# Patient Record
Sex: Male | Born: 1960
Health system: Southern US, Community
[De-identification: ages and names within clinical notes are randomized; demographics above are authoritative.]

## PROBLEM LIST (undated history)

## (undated) DIAGNOSIS — T7840XA Allergy, unspecified, initial encounter: Secondary | ICD-10-CM

## (undated) HISTORY — PX: DENTAL SURGERY: SHX609

## (undated) HISTORY — PX: NASAL FRACTURE SURGERY: SHX718

## (undated) HISTORY — DX: Allergy, unspecified, initial encounter: T78.40XA

---

## 2004-07-10 HISTORY — PX: ORIF ORBITAL FRACTURE: SHX5312

## 2015-02-08 ENCOUNTER — Emergency Department (HOSPITAL_BASED_OUTPATIENT_CLINIC_OR_DEPARTMENT_OTHER): Payer: Worker's Compensation

## 2015-02-08 ENCOUNTER — Encounter (HOSPITAL_BASED_OUTPATIENT_CLINIC_OR_DEPARTMENT_OTHER): Payer: Self-pay | Admitting: *Deleted

## 2015-02-08 ENCOUNTER — Emergency Department (HOSPITAL_BASED_OUTPATIENT_CLINIC_OR_DEPARTMENT_OTHER)
Admission: EM | Admit: 2015-02-08 | Discharge: 2015-02-08 | Disposition: A | Discharge status: Home / Self Care | Payer: Worker's Compensation | Attending: Emergency Medicine | Admitting: Emergency Medicine

## 2015-02-08 DIAGNOSIS — S62522B Displaced fracture of distal phalanx of left thumb, initial encounter for open fracture: Secondary | ICD-10-CM | POA: Diagnosis not present

## 2015-02-08 DIAGNOSIS — Y998 Other external cause status: Secondary | ICD-10-CM | POA: Insufficient documentation

## 2015-02-08 DIAGNOSIS — Y9289 Other specified places as the place of occurrence of the external cause: Secondary | ICD-10-CM | POA: Insufficient documentation

## 2015-02-08 DIAGNOSIS — W270XXA Contact with workbench tool, initial encounter: Secondary | ICD-10-CM | POA: Insufficient documentation

## 2015-02-08 DIAGNOSIS — Y9389 Activity, other specified: Secondary | ICD-10-CM | POA: Diagnosis not present

## 2015-02-08 DIAGNOSIS — S62502B Fracture of unspecified phalanx of left thumb, initial encounter for open fracture: Secondary | ICD-10-CM

## 2015-02-08 DIAGNOSIS — S61012A Laceration without foreign body of left thumb without damage to nail, initial encounter: Secondary | ICD-10-CM | POA: Diagnosis present

## 2015-02-08 MED ORDER — LIDOCAINE HCL (PF) 1 % IJ SOLN
5.0000 mL | Freq: Once | INTRAMUSCULAR | Status: AC
  Administered 2015-02-08: 5 mL via INTRADERMAL
  Filled 2015-02-08: qty 5

## 2015-02-08 MED ORDER — HYDROCODONE-ACETAMINOPHEN 5-325 MG PO TABS
1.0000 | ORAL_TABLET | Freq: Once | ORAL | Status: AC
  Administered 2015-02-08: 1 via ORAL
  Filled 2015-02-08: qty 1

## 2015-02-08 MED ORDER — HYDROCODONE-ACETAMINOPHEN 5-325 MG PO TABS: 1.0000 | ORAL_TABLET | Freq: Four times a day (QID) | ORAL | Status: DC | PRN

## 2015-02-08 MED ORDER — CEPHALEXIN 250 MG PO CAPS
500.0000 mg | ORAL_CAPSULE | Freq: Once | ORAL | Status: AC
  Administered 2015-02-08: 500 mg via ORAL
  Filled 2015-02-08: qty 2

## 2015-02-08 MED ORDER — CEPHALEXIN 500 MG PO CAPS: 500.0000 mg | ORAL_CAPSULE | Freq: Two times a day (BID) | ORAL | Status: DC

## 2015-02-08 NOTE — ED Notes (Signed)
Suture cart placed at bedside. 

## 2015-02-08 NOTE — ED Notes (Signed)
Pt amb to room 9 with quick steady gait in nad. Pt reports laceration to left thumb with table saw just pta. Pt supervisor is at bedside, dsd removed, supervisor states he put "stop bleed powder" onto wound, no active bleeding noted, but unable to assess wound due to thick powder all over thumb. Thumb soaked in ns/betadine solution, md at bedside for eval.

## 2015-02-08 NOTE — ED Provider Notes (Signed)
CSN: [id number]     Arrival date & time 02/08/15  0803 History   First MD Initiated Contact with Patient 02/08/15 [phone]     Chief Complaint  Patient presents with  . Laceration     (Consider location/radiation/quality/duration/timing/severity/associated sxs/prior Treatment) HPI Patient presents immediately after sustaining injury to his left thumb while using a table saw. Patient was in his usual state of health prior to the event. Immediately after suffering laceration, patient had topical hemostatic agent applied. No other injuries, no other trauma. Since the event there has been minimal active bleeding, but ongoing moderate pain in the left distal thumb. Patient moves the thumb appropriately.  History reviewed. No pertinent past medical history. History reviewed. No pertinent past surgical history. History reviewed. No pertinent family history. History  Substance Use Topics  . Smoking status: Never Smoker   . Smokeless tobacco: Not on file  . Alcohol Use: Not on file    Review of Systems  Constitutional: Negative for fever.  Respiratory: Negative for shortness of breath.   Cardiovascular: Negative for chest pain.  Musculoskeletal:       Negative aside from HPI  Skin: Positive for wound.       Negative aside from HPI  Allergic/Immunologic: Negative for immunocompromised state.  Neurological: Negative for weakness.      Allergies  Review of patient's allergies indicates no known allergies.  Home Medications   Prior to Admission medications   Not on File   BP 128/88 mmHg  Pulse 78  Temp(Src) 98.2 F (36.8 C) (Oral)  Resp 18  Ht 5\' 11"  (1.803 m)  Wt 170 lb (77.111 kg)  BMI 23.72 kg/m2  SpO2 100% Physical Exam  Constitutional: He is oriented to person, place, and time. He appears well-developed. No distress.  HENT:  Head: Normocephalic and atraumatic.  Eyes: Conjunctivae and EOM are normal.  Cardiovascular: Normal rate and regular rhythm.     Pulmonary/Chest: Effort normal. No stridor. No respiratory distress.  Abdominal: He exhibits no distension.  Musculoskeletal: He exhibits no edema.       Arms: Neurological: He is alert and oriented to person, place, and time.  Skin: Skin is warm and dry.  Psychiatric: He has a normal mood and affect.  Nursing note and vitals reviewed.   ED Course  Procedures (including critical care time) Labs Review Labs Reviewed - No data to display  Imaging Review Dg Finger Thumb Left  02/08/2015   CLINICAL DATA:  Table saw injury today  EXAM: LEFT THUMB 2+V  COMPARISON:  None.  FINDINGS: Three views of the left thumb submitted. There is small avulsion fracture of distal phalanx with associated soft tissue injury. No subluxation.  IMPRESSION: Small avulsion fraction of distal phalanx is associated soft tissue injury/irregularity.   Electronically Signed   By: Lahoma Crocker M.D.   On: 02/08/2015 08:39    I reviewed the x-ray findings the patient, agree with the interpretation, reviewed the images myself. We discussed the depth of the wound, the need for close outpatient follow-up with our hand surgeon, work precautions, return instructions.  LACERATION REPAIR Performed by: Carmin Muskrat Authorized by: Carmin Muskrat Consent: Verbal consent obtained. Risks and benefits: risks, benefits and alternatives were discussed Consent given by: patient Patient identity confirmed: provided demographic data Prepped and Draped in normal sterile fashion Wound explored  Laceration Location: L thumb  Laceration Length: 6cm  No Foreign Bodies seen or palpated  Anesthesia: local infiltration  Local anesthetic: lidocaine 1% no epinephrine  Anesthetic  total: 3 ml  Irrigation method: syringe Amount of cleaning: standard  Skin closure: 4-0  Number of sutures: 3  Technique: rough / loose approximation  Patient tolerance: Patient tolerated the procedure well with no immediate complications.  With  complete avulsion of part of the wound, and with very rough edges on the remainder, loose approximation was performed, with good hemostasis, the procedure was well tolerated.  Patient received a splint well, no consultations.   MDM  Patient presents after sustaining injury while using a table saw. Patient has both avulsion and laceration of the distal thumb, but is neurologically intact. Hemostasis was controlled on site, with minimal active bleeding after arrival here. Patient had substantial irrigation, cleaning, tolerated rough approximation of the edges well, was splinted, discharged in stable condition to follow-up with hand surgery.   Carmin Muskrat, MD 02/08/15 [phone]

## 2015-02-08 NOTE — Discharge Instructions (Signed)
As discussed, with your laceration, and avulsion of your thumb is very important that you follow-up with our hand surgeon specialists. Your sutures should be removed by the hand surgeon.  Please call today for appointment this week.  Return here for concerning changes in your condition.

## 2015-02-08 NOTE — ED Notes (Signed)
Supplies gathered and placed at bedside for md. 

## 2015-02-08 NOTE — ED Notes (Signed)
Patient transported from X-ray

## 2015-02-08 NOTE — ED Notes (Signed)
Patient transported to X-ray 

## 2015-02-08 NOTE — ED Notes (Signed)
MD at bedside suturing. Pt tolerating well.

## 2016-08-06 IMAGING — DX DG FINGER THUMB 2+V*L*
3 series · 3 of 3 positions shown · non-contrast
Comparison: None.

CLINICAL DATA: Table saw injury today

EXAM:
LEFT THUMB 2+V

[finger ap]
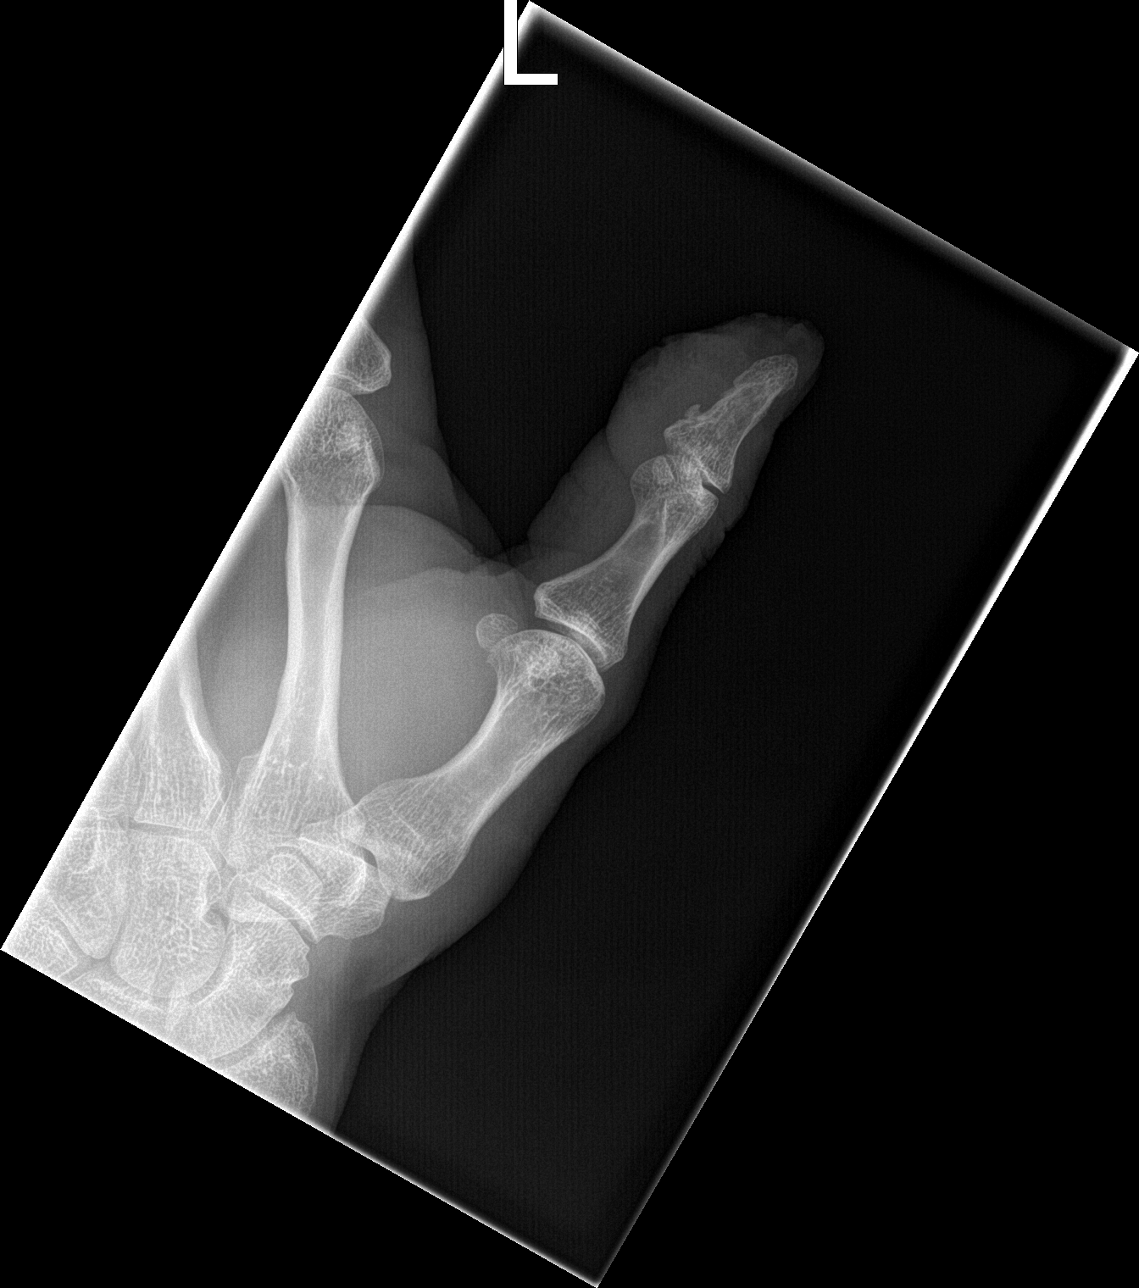

[finger obl]
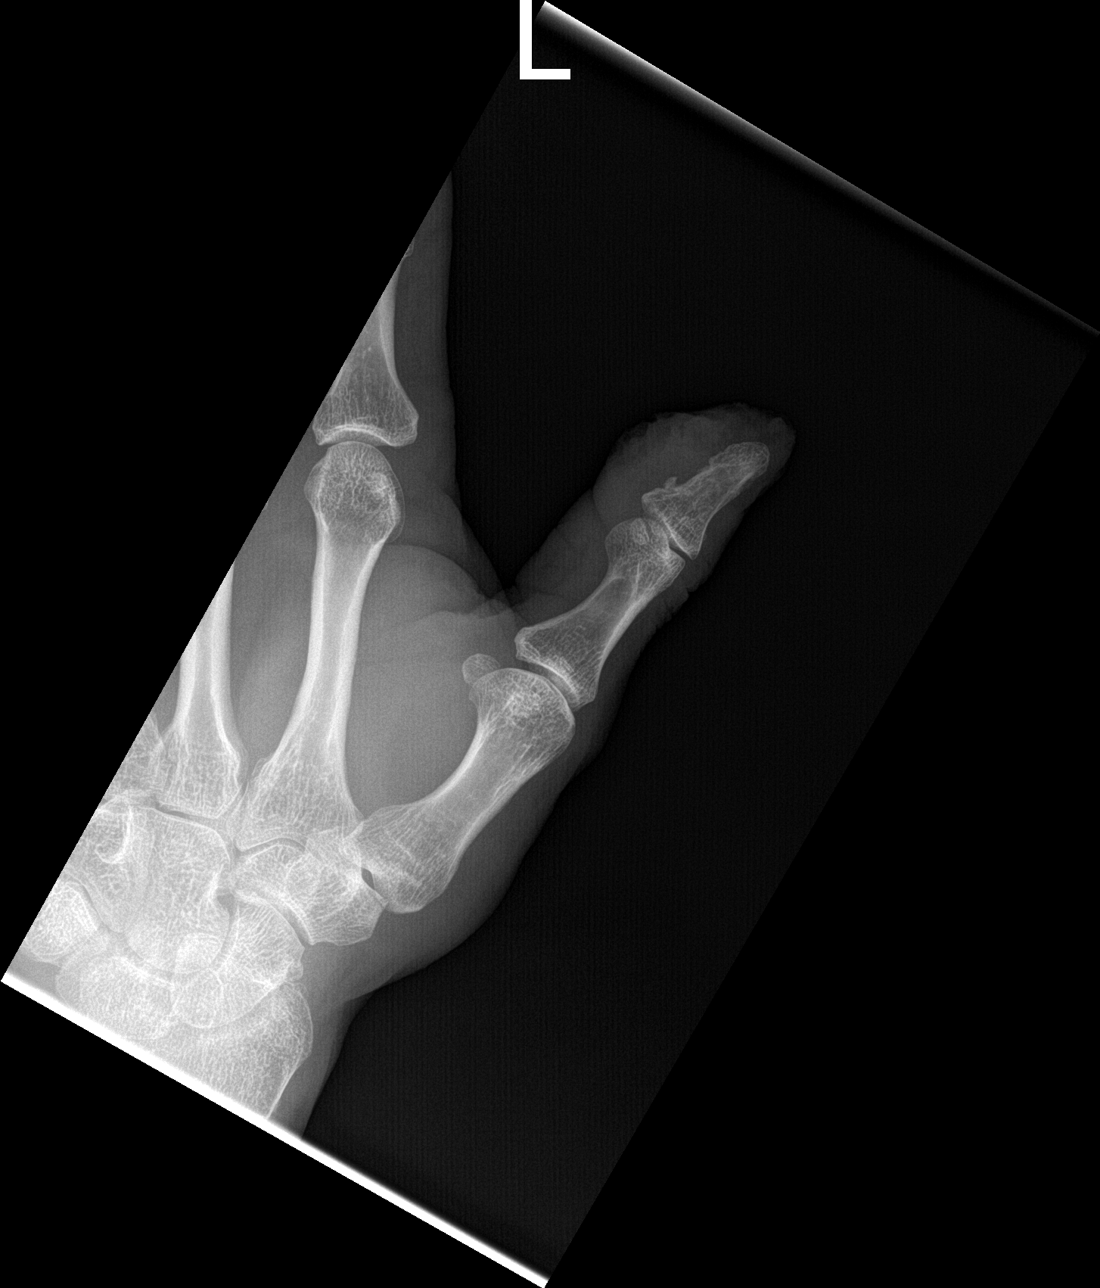

[finger lat]
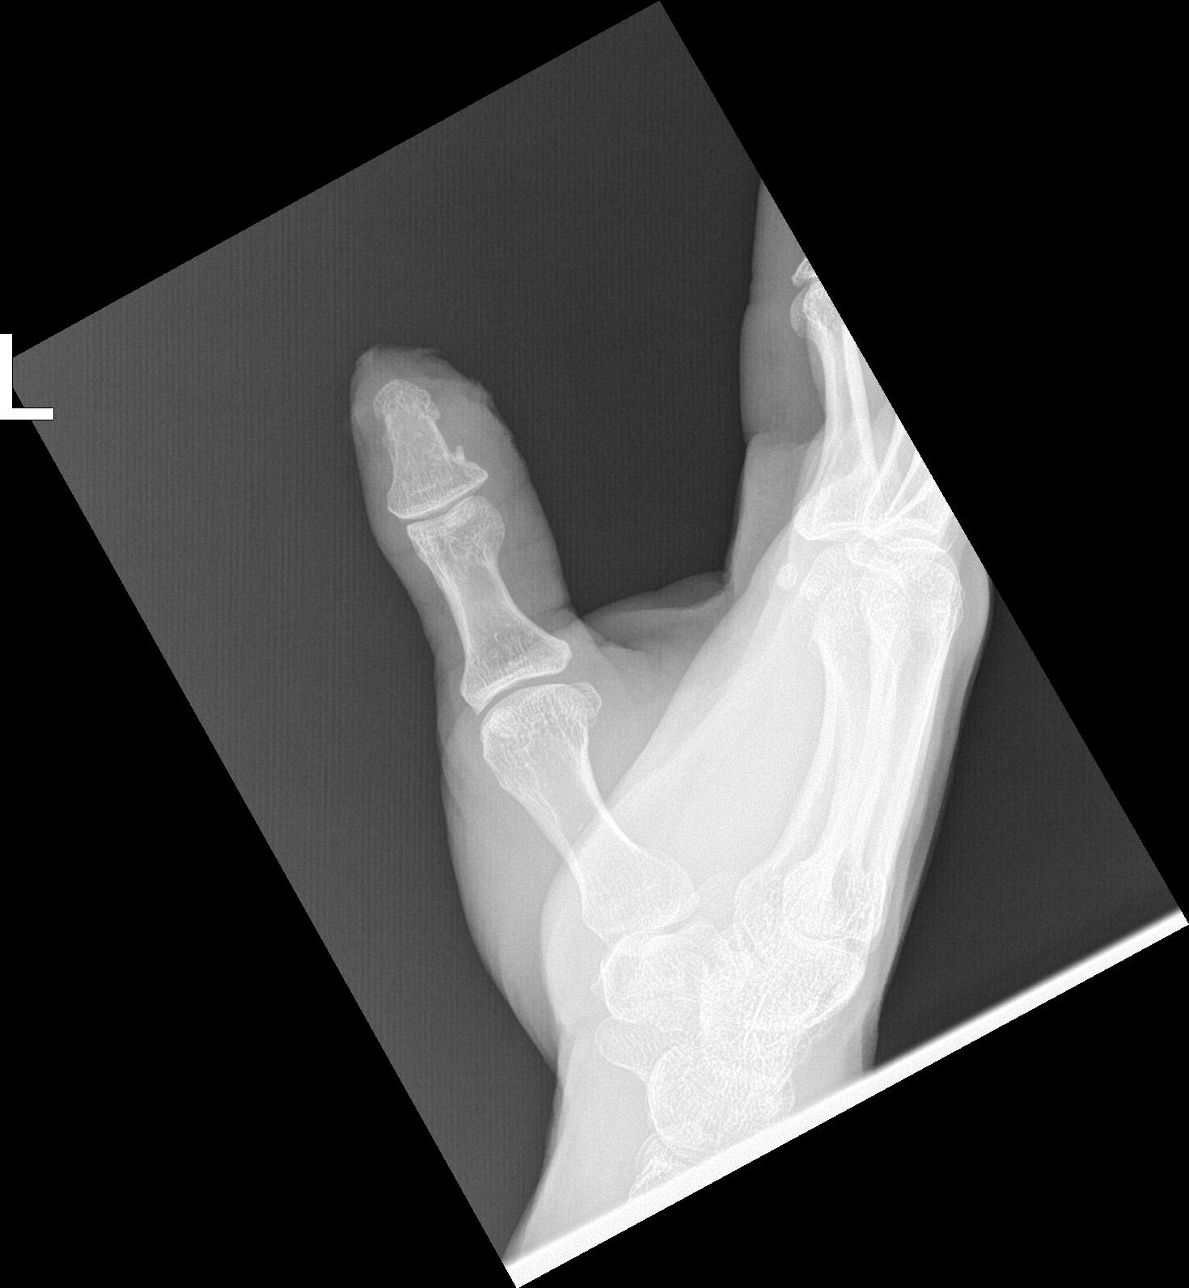

[3 of 3 positions shown; findings below may reference images not displayed]

FINDINGS: Three views of the left thumb submitted. There is small avulsion
fracture of distal phalanx with associated soft tissue injury. No
subluxation.
IMPRESSION: Small avulsion fraction of distal phalanx is associated soft tissue
injury/irregularity.

## 2019-12-16 ENCOUNTER — Telehealth: Payer: Self-pay | Admitting: General Practice

## 2019-12-16 NOTE — Telephone Encounter (Signed)
Paz ok'd new patient appointment

## 2019-12-18 ENCOUNTER — Other Ambulatory Visit: Payer: Self-pay

## 2020-01-09 ENCOUNTER — Encounter: Payer: Self-pay | Admitting: Internal Medicine

## 2020-01-09 ENCOUNTER — Other Ambulatory Visit: Payer: Self-pay

## 2020-01-09 ENCOUNTER — Ambulatory Visit: Payer: 59 | Admitting: Internal Medicine

## 2020-01-09 VITALS — BP 126/86 | HR 76 | Temp 98.3°F | Resp 18 | Ht 71.0 in | Wt 175.0 lb

## 2020-01-09 DIAGNOSIS — Z125 Encounter for screening for malignant neoplasm of prostate: Secondary | ICD-10-CM

## 2020-01-09 DIAGNOSIS — Z0000 Encounter for general adult medical examination without abnormal findings: Secondary | ICD-10-CM | POA: Diagnosis not present

## 2020-01-09 DIAGNOSIS — Z1211 Encounter for screening for malignant neoplasm of colon: Secondary | ICD-10-CM

## 2020-01-09 LAB — COMPREHENSIVE METABOLIC PANEL
ALT: 16 U/L (ref 0–53)
AST: 19 U/L (ref 0–37)
Albumin: 4.6 g/dL (ref 3.5–5.2)
Alkaline Phosphatase: 53 U/L (ref 39–117)
BUN: 13 mg/dL (ref 6–23)
CO2: 31 mEq/L (ref 19–32)
Calcium: 9.7 mg/dL (ref 8.4–10.5)
Chloride: 101 mEq/L (ref 96–112)
Creatinine, Ser: 0.87 mg/dL (ref 0.40–1.50)
GFR: 90.24 mL/min (ref >60.00)
Glucose, Bld: 96 mg/dL (ref 70–99)
Potassium: 4.7 mEq/L (ref 3.5–5.1)
Sodium: 137 mEq/L (ref 135–145)
Total Bilirubin: 0.6 mg/dL (ref 0.2–1.2)
Total Protein: 6.8 g/dL (ref 6.0–8.3)

## 2020-01-09 LAB — CBC WITH DIFFERENTIAL/PLATELET
Basophils Absolute: 0 10*3/uL (ref 0.0–0.1)
Basophils Relative: 0.4 % (ref 0.0–3.0)
Eosinophils Absolute: 0.1 10*3/uL (ref 0.0–0.7)
Eosinophils Relative: 2.2 % (ref 0.0–5.0)
HCT: 45.3 % (ref 39.0–52.0)
Hemoglobin: 15.1 g/dL (ref 13.0–17.0)
Lymphocytes Relative: 22.6 % (ref 12.0–46.0)
Lymphs Abs: 1.2 10*3/uL (ref 0.7–4.0)
MCHC: 33.4 g/dL (ref 30.0–36.0)
MCV: 90.3 fl (ref 78.0–100.0)
Monocytes Absolute: 0.4 10*3/uL (ref 0.1–1.0)
Monocytes Relative: 6.8 % (ref 3.0–12.0)
Neutro Abs: 3.7 10*3/uL (ref 1.4–7.7)
Neutrophils Relative %: 68 % (ref 43.0–77.0)
Platelets: 172 10*3/uL (ref 150.0–400.0)
RBC: 5.02 Mil/uL (ref 4.22–5.81)
RDW: 13.4 % (ref 11.5–15.5)
WBC: 5.5 10*3/uL (ref 4.0–10.5)

## 2020-01-09 LAB — LIPID PANEL
Cholesterol: 182 mg/dL (ref 0–200)
HDL: 58.5 mg/dL (ref >39.00)
LDL Cholesterol: 101 mg/dL — ABNORMAL HIGH (ref 0–99)
NonHDL: 123.84
Total CHOL/HDL Ratio: 3
Triglycerides: 112 mg/dL (ref 0.0–149.0)
VLDL: 22.4 mg/dL (ref 0.0–40.0)

## 2020-01-09 LAB — PSA: PSA: 0.53 ng/mL (ref 0.10–4.00)

## 2020-01-09 LAB — TSH: TSH: 1.11 u[IU]/mL (ref 0.35–4.50)

## 2020-01-09 NOTE — Patient Instructions (Addendum)
Recommend to get a flu shot yearly  GO TO THE LAB : Get the blood work     [place], [place] back for a physical exam in 1 year  We are referring you to the gastroenterologist
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
## 2020-01-09 NOTE — Progress Notes (Signed)
Pre visit review using our clinic review tool, if applicable. No additional management support is needed unless otherwise documented below in the visit note. 

## 2020-01-11 ENCOUNTER — Encounter: Payer: Self-pay | Admitting: Internal Medicine

## 2020-01-11 DIAGNOSIS — Z0000 Encounter for general adult medical examination without abnormal findings: Secondary | ICD-10-CM | POA: Insufficient documentation

## 2020-01-11 NOTE — Assessment & Plan Note (Signed)
Td : ~ 2016 per pt S/p pfizer covid shot rec flu shot q year CCS: + FH sister age ~ 2.  Recommend GI referral Prostate cancer screening: No previous PSAs available, DRE with question of mild induration, see physical exam.  Check a PSA, if normal reassess next year. Labs: CMP, FLP, CBC, TSH, PSA. Lifestyle: Healthy, takes walks 3 times a week, diet is low-salt low-fat.

## 2020-01-14 ENCOUNTER — Encounter: Payer: Self-pay | Admitting: Gastroenterology

## 2020-02-24 ENCOUNTER — Ambulatory Visit (AMBULATORY_SURGERY_CENTER): Payer: Self-pay | Admitting: *Deleted

## 2020-02-24 ENCOUNTER — Other Ambulatory Visit: Payer: Self-pay

## 2020-02-24 VITALS — Ht 71.0 in | Wt 175.0 lb

## 2020-02-24 DIAGNOSIS — Z800 Family history of malignant neoplasm of digestive organs: Secondary | ICD-10-CM

## 2020-02-24 MED ORDER — SUTAB 1479-225-188 MG PO TABS: 24.0000 | ORAL_TABLET | ORAL | 0 refills | Status: DC

## 2020-02-24 MED FILL — SUTAB 1479-225-188 MG TABS: 1479-225-18 | 1 days supply | Qty: 24 | Fill #0

## 2020-02-24 NOTE — Progress Notes (Signed)

## 2020-02-25 ENCOUNTER — Encounter: Payer: Self-pay | Admitting: Gastroenterology

## 2020-03-02 ENCOUNTER — Encounter: Payer: Self-pay | Admitting: Certified Registered Nurse Anesthetist

## 2020-03-03 ENCOUNTER — Encounter: Payer: Self-pay | Admitting: Gastroenterology

## 2020-03-03 ENCOUNTER — Ambulatory Visit (AMBULATORY_SURGERY_CENTER): Payer: 59 | Admitting: Gastroenterology

## 2020-03-03 ENCOUNTER — Other Ambulatory Visit: Payer: Self-pay

## 2020-03-03 VITALS — BP 120/80 | HR 61 | Temp 97.7°F | Resp 20 | Ht 71.0 in | Wt 175.0 lb

## 2020-03-03 DIAGNOSIS — K640 First degree hemorrhoids: Secondary | ICD-10-CM

## 2020-03-03 DIAGNOSIS — D125 Benign neoplasm of sigmoid colon: Secondary | ICD-10-CM | POA: Diagnosis not present

## 2020-03-03 DIAGNOSIS — Z1211 Encounter for screening for malignant neoplasm of colon: Secondary | ICD-10-CM

## 2020-03-03 DIAGNOSIS — Z800 Family history of malignant neoplasm of digestive organs: Secondary | ICD-10-CM | POA: Diagnosis present

## 2020-03-03 MED ORDER — SODIUM CHLORIDE 0.9 % IV SOLN: 500.0000 mL | Freq: Once | INTRAVENOUS | Status: DC

## 2020-03-03 NOTE — Op Note (Signed)
Garrett Oconnor Patient Name: Garrett Oconnor Procedure Date: 03/03/2020 6:58 AM MRN: [id number] Endoscopist: Gerrit Heck , MD Age: 59 Referring MD:  Date of Birth: Aug 20, 1960 Gender: Male Account #: [id number] Procedure:                Colonoscopy Indications:              Screening in patient at increased risk: Colorectal                            cancer in sister before age 75, This is the                            patient's first colonoscopy. He is otherwise                            without active GI symptoms. Medicines:                Monitored Anesthesia Care Procedure:                Pre-Anesthesia Assessment:                           - Prior to the procedure, a History and Physical                            was performed, and patient medications and                            allergies were reviewed. The patient's tolerance of                            previous anesthesia was also reviewed. The risks                            and benefits of the procedure and the sedation                            options and risks were discussed with the patient.                            All questions were answered, and informed consent                            was obtained. Prior Anticoagulants: The patient has                            taken no previous anticoagulant or antiplatelet                            agents. ASA Grade Assessment: II - A patient with                            mild systemic disease. After reviewing the risks  and benefits, the patient was deemed in                            satisfactory condition to undergo the procedure.                           After obtaining informed consent, the colonoscope                            was passed under direct vision. Throughout the                            procedure, the patient's blood pressure, pulse, and                            oxygen saturations were monitored  continuously. The                            Colonoscope was introduced through the anus and                            advanced to the the cecum, identified by                            appendiceal orifice and ileocecal valve. The                            colonoscopy was performed without difficulty. The                            patient tolerated the procedure well. The quality                            of the bowel preparation was good. The ileocecal                            valve, appendiceal orifice, and rectum were                            photographed. Scope In: 8:05:24 AM Scope Out: 8:23:17 AM Scope Withdrawal Time: 0 hours 15 minutes 35 seconds  Total Procedure Duration: 0 hours 17 minutes 53 seconds  Findings:                 The perianal and digital rectal examinations were                            normal.                           Two sessile polyps were found in the sigmoid colon.                            The polyps were 6 to 8 mm in size. These polyps  were removed with a cold snare. Resection and                            retrieval were complete. Estimated blood loss was                            minimal.                           Non-bleeding internal hemorrhoids were found during                            retroflexion. The hemorrhoids were medium-sized.                           The exam was otherwise normal throughout the                            remainder of the colon. Complications:            No immediate complications. Estimated Blood Loss:     Estimated blood loss was minimal. Impression:               - Two 6 to 8 mm polyps in the sigmoid colon,                            removed with a cold snare. Resected and retrieved.                           - Non-bleeding internal hemorrhoids. Recommendation:           - Patient has a contact number available for                            emergencies. The signs and symptoms of  potential                            delayed complications were discussed with the                            patient. Return to normal activities tomorrow.                            Written discharge instructions were provided to the                            patient.                           - Resume previous diet.                           - Continue present medications.                           - Await pathology results.                           -  Repeat colonoscopy in 3 - 5 years for                            surveillance based on pathology results.                           - Return to GI clinic PRN.                           - Use fiber, for example Citrucel, Fibercon, Konsyl                            or Metamucil.                           - Internal hemorrhoids were noted on this study and                            may be amenable to hemorrhoid band ligation. If you                            are interested in further treatment of these                            hemorrhoids with band ligation, please contact my                            clinic to set up an appointment for evaluation and                            treatment. Gerrit Heck, MD 03/03/2020 8:27:59 AM

## 2020-03-03 NOTE — Progress Notes (Signed)
Called to room to assist during endoscopic procedure.  Patient ID and intended procedure confirmed with present staff. Received instructions for my participation in the procedure from the performing physician.  

## 2020-03-03 NOTE — Progress Notes (Signed)
VS-CW  Pt's states no medical or surgical changes since previsit or office visit.  

## 2020-03-03 NOTE — Progress Notes (Signed)
Report given to PACU, vss 

## 2020-03-03 NOTE — Patient Instructions (Signed)
YOU HAD AN ENDOSCOPIC PROCEDURE TODAY AT [facility]:   Refer to the procedure report that was given to you for any specific questions about what was found during the examination.  If the procedure report does not answer your questions, please call your gastroenterologist to clarify.  If you requested that your care partner not be given the details of your procedure findings, then the procedure report has been included in a sealed envelope for you to review at your convenience later.  YOU SHOULD EXPECT: Some feelings of bloating in the abdomen. Passage of more gas than usual.  Walking can help get rid of the air that was put into your GI tract during the procedure and reduce the bloating. If you had a lower endoscopy (such as a colonoscopy or flexible sigmoidoscopy) you may notice spotting of blood in your stool or on the toilet paper. If you underwent a bowel prep for your procedure, you may not have a normal bowel movement for a few days.  Please Note:  You might notice some irritation and congestion in your nose or some drainage.  This is from the oxygen used during your procedure.  There is no need for concern and it should clear up in a day or so.  SYMPTOMS TO REPORT IMMEDIATELY:   Following lower endoscopy (colonoscopy or flexible sigmoidoscopy):  Excessive amounts of blood in the stool  Significant tenderness or worsening of abdominal pains  Swelling of the abdomen that is new, acute  Fever of 100F or higher   For urgent or emergent issues, a gastroenterologist can be reached at any hour by calling [phone]. Do not use MyChart messaging for urgent concerns.    DIET:  We do recommend a small meal at first, but then you may proceed to your regular diet.  Drink plenty of fluids but you should avoid alcoholic beverages for 24 hours.  ACTIVITY:  You should plan to take it easy for the rest of today and you should NOT DRIVE or use heavy machinery until tomorrow (because  of the sedation medicines used during the test).    FOLLOW UP: Our staff will call the number listed on your records 48-72 hours following your procedure to check on you and address any questions or concerns that you may have regarding the information given to you following your procedure. If we do not reach you, we will leave a message.  We will attempt to reach you two times.  During this call, we will ask if you have developed any symptoms of COVID 19. If you develop any symptoms (ie: fever, flu-like symptoms, shortness of breath, cough etc.) before then, please call [phone].  If you test positive for Covid 19 in the 2 weeks post procedure, please call and report this information to [place].    If any biopsies were taken you will be contacted by phone or by letter within the next 1-3 weeks.  Please call us at [phone] if you have not heard about the biopsies in 3 weeks.    SIGNATURES/CONFIDENTIALITY: You and/or your care partner have signed paperwork which will be entered into your electronic medical record.  These signatures attest to the fact that that the information above on your After Visit Summary has been reviewed and is understood.  Full responsibility of the confidentiality of this discharge information lies with you and/or your care-partner.   Resume medications. Use over the counter fiber supplement,example Citrucel,Fibercon,Konsyl or Metamucil. Information given on polyps and  hemorrhoids.

## 2020-03-05 ENCOUNTER — Telehealth: Payer: Self-pay | Admitting: *Deleted

## 2020-03-05 NOTE — Telephone Encounter (Signed)
°  Follow up Call-  Call back number 03/03/2020  Post procedure Call Back phone  # [phone]  Permission to leave phone message Yes  Some recent data might be hidden     Patient questions:  Do you have a fever, pain , or abdominal swelling? No. Pain Score  0 *  Have you tolerated food without any problems? Yes.    Have you been able to return to your normal activities? Yes.    Do you have any questions about your discharge instructions: Diet   No. Medications  No. Follow up visit  No.  Do you have questions or concerns about your Care? No.  Actions: * If pain score is 4 or above: No action needed, pain <4.  1. Have you developed a fever since your procedure? no  2.   Have you had an respiratory symptoms (SOB or cough) since your procedure? no  3.   Have you tested positive for COVID 19 since your procedure no  4.   Have you had any family members/close contacts diagnosed with the COVID 19 since your procedure?  no   If yes to any of these questions please route to Joylene John, RN and Joella Prince, RN

## 2020-03-16 ENCOUNTER — Encounter: Payer: Self-pay | Admitting: Gastroenterology

## 2020-04-19 ENCOUNTER — Encounter: Payer: Self-pay | Admitting: Gastroenterology

## 2020-04-19 ENCOUNTER — Ambulatory Visit (INDEPENDENT_AMBULATORY_CARE_PROVIDER_SITE_OTHER): Payer: 59 | Admitting: Gastroenterology

## 2020-04-19 ENCOUNTER — Other Ambulatory Visit: Payer: Self-pay

## 2020-04-19 VITALS — BP 122/82 | HR 83 | Ht 71.0 in | Wt 178.1 lb

## 2020-04-19 DIAGNOSIS — Z86010 Personal history of colonic polyps: Secondary | ICD-10-CM

## 2020-04-19 DIAGNOSIS — K649 Unspecified hemorrhoids: Secondary | ICD-10-CM

## 2020-04-19 DIAGNOSIS — K641 Second degree hemorrhoids: Secondary | ICD-10-CM

## 2020-04-19 DIAGNOSIS — Z800 Family history of malignant neoplasm of digestive organs: Secondary | ICD-10-CM
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
## 2020-04-19 NOTE — Patient Instructions (Signed)
If you are age 58 or older, your body mass index should be between 23-30. Your Body mass index is 24.84 kg/m. If this is out of the aforementioned range listed, please consider follow up with your Primary Care Provider.  If you are age 74 or younger, your body mass index should be between 19-25. Your Body mass index is 24.84 kg/m. If this is out of the aformentioned range listed, please consider follow up with your Primary Care Provider.   Next appointment is 05/17/2020 at 1:20pm

## 2020-05-17 ENCOUNTER — Ambulatory Visit: Payer: 59 | Admitting: Gastroenterology

## 2020-05-17 ENCOUNTER — Encounter: Payer: Self-pay | Admitting: Gastroenterology

## 2020-05-17 VITALS — BP 106/64 | HR 96 | Ht 72.0 in | Wt 173.1 lb

## 2020-05-17 DIAGNOSIS — Z800 Family history of malignant neoplasm of digestive organs: Secondary | ICD-10-CM

## 2020-05-17 DIAGNOSIS — L290 Pruritus ani: Secondary | ICD-10-CM | POA: Diagnosis not present

## 2020-05-17 DIAGNOSIS — K641 Second degree hemorrhoids: Secondary | ICD-10-CM | POA: Diagnosis not present

## 2020-05-17 DIAGNOSIS — K649 Unspecified hemorrhoids: Secondary | ICD-10-CM | POA: Diagnosis not present

## 2020-05-17 NOTE — Progress Notes (Signed)
    P  Chief Complaint:    Symptomatic Internal Hemorrhoids; Hemorrhoid Band Ligation  GI History: 59 year old male with history of symptomatic internal hemorrhoids (rectal irritation, pruritus ani) along with family history of colon cancer.  -04/19/2020: Successful banding of the LL hemorrhoid -05/17/2020: Presents for hemorrhoid banding #2  Family history notable for sister with colon cancer diagnosed prior to age 57.  Endoscopic History: -Colonoscopy (03/03/2020): 2 subcentimeter polyps in sigmoid colon (path: Tubular adenomas) and internal hemorrhoids, with recommendation repeat in 5 years   HPI:     Patient is a 59 y.o. malewith a history of symptomatic internal hemorrhoids presenting to the [facility] for follow-up and ongoing treatment. The patient presents with symptomatic grade 2 hemorrhoids, unresponsive to maximal medical therapy, requesting rubber band ligation of symptomatic hemorrhoidal disease.  No issues after first banding. Presents today for hemorrhoid banding #2.  No change in medical or surgical history, medications, allergies, social history since last appointment with me.   Review of systems:     No chest pain, no SOB, no fevers, no urinary sx   Past Medical History:  Diagnosis Date  . Allergy     Patient's surgical history, family medical history, social history, medications and allergies were all reviewed in Epic    Current Outpatient Medications  Medication Sig Dispense Refill  . Multiple Vitamin (MULTIVITAMIN WITH MINERALS) TABS tablet Take 1 tablet by mouth daily.     No current facility-administered medications for this visit.    Physical Exam:     Ht 6' (1.829 m)   Wt 173 lb 2 oz (78.5 kg)   BMI 23.48 kg/m   GENERAL:  Pleasant male in NAD PSYCH: : Cooperative, normal affect Rectal exam: Sensation intact and preserved anal wink.  Grade 2 hemorrhoids in RA/RP positions.  No external anal fissures noted. Normal sphincter tone.  No palpable mass. No blood on the exam glove. (Chaperone: Curlene Labrum, CMA).   IMPRESSION and PLAN:    #1.  Symptomatic internal hemorrhoids: PROCEDURE NOTE: The patient presents with symptomatic grade 2 hemorrhoids, unresponsive to maximal medical therapy, requesting rubber band ligation of symptomatic hemorrhoidal disease.  All risks, benefits and alternative forms of therapy were described and informed consent was obtained.  In the Left Lateral Decubitus position, anoscopic examination revealed grade 2 hemorrhoids in the RA/RP position(s).  The anorectum was pre-medicated with RectiCare. The decision was made to band the RP internal hemorrhoid, and the Marie was used to perform band ligation without complication.  Digital anorectal examination was then performed to assure proper positioning of the band, and to adjust the banded tissue as required.  The patient was discharged home without pain or other issues.  Dietary and behavioral recommendations were given and along with follow-up instructions.     The following adjunctive treatments were recommended:  -Resume high-fiber diet with fiber supplement (i.e. Citrucel or Benefiber) with goal for soft stools without straining to have a BM. -Resume adequate fluid intake.  The patient will return in 4 for  follow-up and possible additional banding as required. No complications were encountered and the patient tolerated the procedure well.      #2.  Family history of colon cancer #3.  Personal history of colon polyps (tubular adenomas) -Repeat colonoscopy 2026 for ongoing polyp surveillance      Garrett Oconnor ,DO, FACG 05/17/2020, 1:29 PM

## 2020-05-17 NOTE — Patient Instructions (Signed)
If you are age 58 or older, your body mass index should be between 23-30. Your Body mass index is 23.48 kg/m. If this is out of the aforementioned range listed, please consider follow up with your Primary Care Provider.  If you are age 41 or younger, your body mass index should be between 19-25. Your Body mass index is 23.48 kg/m. If this is out of the aformentioned range listed, please consider follow up with your Primary Care Provider.   HEMORRHOID BANDING PROCEDURE    FOLLOW-UP CARE   1. The procedure you have had should have been relatively painless since the banding of the area involved does not have nerve endings and there is no pain sensation.  The rubber band cuts off the blood supply to the hemorrhoid and the band may fall off as soon as 48 hours after the banding (the band may occasionally be seen in the toilet bowl following a bowel movement). You may notice a temporary feeling of fullness in the rectum which should respond adequately to plain Tylenol or Motrin.  2. Following the banding, avoid strenuous exercise that evening and resume full activity the next day.  A sitz bath (soaking in a warm tub) or bidet is soothing, and can be useful for cleansing the area after bowel movements.     3. To avoid constipation, take two tablespoons of natural wheat bran, natural oat bran, flax, Benefiber or any over the counter fiber supplement and increase your water intake to 7-8 glasses daily.    4. Unless you have been prescribed anorectal medication, do not put anything inside your rectum for two weeks: No suppositories, enemas, fingers, etc.  5. Occasionally, you may have more bleeding than usual after the banding procedure.  This is often from the untreated hemorrhoids rather than the treated one.  Don't be concerned if there is a tablespoon or so of blood.  If there is more blood than this, lie flat with your bottom higher than your head and apply an ice pack to the area. If the bleeding  does not stop within a half an hour or if you feel faint, call our office at [phone] or go to the emergency room.  6. Problems are not common; however, if there is a substantial amount of bleeding, severe pain, chills, fever or difficulty passing urine (very rare) or other problems, you should call us at [phone] or report to the nearest emergency room.  7. Do not stay seated continuously for more than 2-3 hours for a day or two after the procedure.  Tighten your buttock muscles 10-15 times every two hours and take 10-15 deep breaths every 1-2 hours.  Do not spend more than a few minutes on the toilet if you cannot empty your bowel; instead re-visit the toilet at a later time.    Due to recent changes in healthcare laws, you may see the results of your imaging and laboratory studies on MyChart before your provider has had a chance to review them.  We understand that in some cases there may be results that are confusing or concerning to you. Not all laboratory results come back in the same time frame and the provider may be waiting for multiple results in order to interpret others.  Please give [place] 48 hours in order for your provider to thoroughly review all the results before contacting the office for clarification of your results.   Thank you for choosing me and [facility]  Dr  Cirigliano

## 2020-06-29 ENCOUNTER — Ambulatory Visit: Payer: 59 | Admitting: Gastroenterology

## 2020-06-29 ENCOUNTER — Encounter: Payer: Self-pay | Admitting: Gastroenterology

## 2020-06-29 VITALS — BP 122/76 | HR 91 | Ht 71.0 in | Wt 180.2 lb

## 2020-06-29 DIAGNOSIS — L290 Pruritus ani: Secondary | ICD-10-CM

## 2020-06-29 DIAGNOSIS — K641 Second degree hemorrhoids: Secondary | ICD-10-CM | POA: Diagnosis not present

## 2020-06-29 DIAGNOSIS — Z86010 Personal history of colonic polyps: Secondary | ICD-10-CM

## 2020-06-29 DIAGNOSIS — Z800 Family history of malignant neoplasm of digestive organs: Secondary | ICD-10-CM

## 2020-06-29 NOTE — Patient Instructions (Signed)
If you are age 58 or older, your body mass index should be between 23-30. Your Body mass index is 25.14 kg/m. If this is out of the aforementioned range listed, please consider follow up with your Primary Care Provider.  If you are age 28 or younger, your body mass index should be between 19-25. Your Body mass index is 25.14 kg/m. If this is out of the aformentioned range listed, please consider follow up with your Primary Care Provider.   HEMORRHOID BANDING PROCEDURE    FOLLOW-UP CARE   1. The procedure you have had should have been relatively painless since the banding of the area involved does not have nerve endings and there is no pain sensation.  The rubber band cuts off the blood supply to the hemorrhoid and the band may fall off as soon as 48 hours after the banding (the band may occasionally be seen in the toilet bowl following a bowel movement). You may notice a temporary feeling of fullness in the rectum which should respond adequately to plain Tylenol or Motrin.  2. Following the banding, avoid strenuous exercise that evening and resume full activity the next day.  A sitz bath (soaking in a warm tub) or bidet is soothing, and can be useful for cleansing the area after bowel movements.     3. To avoid constipation, take two tablespoons of natural wheat bran, natural oat bran, flax, Benefiber or any over the counter fiber supplement and increase your water intake to 7-8 glasses daily.    4. Unless you have been prescribed anorectal medication, do not put anything inside your rectum for two weeks: No suppositories, enemas, fingers, etc.  5. Occasionally, you may have more bleeding than usual after the banding procedure.  This is often from the untreated hemorrhoids rather than the treated one.  Don't be concerned if there is a tablespoon or so of blood.  If there is more blood than this, lie flat with your bottom higher than your head and apply an ice pack to the area. If the bleeding  does not stop within a half an hour or if you feel faint, call our office at [phone] or go to the emergency room.  6. Problems are not common; however, if there is a substantial amount of bleeding, severe pain, chills, fever or difficulty passing urine (very rare) or other problems, you should call us at [phone] or report to the nearest emergency room.  7. Do not stay seated continuously for more than 2-3 hours for a day or two after the procedure.  Tighten your buttock muscles 10-15 times every two hours and take 10-15 deep breaths every 1-2 hours.  Do not spend more than a few minutes on the toilet if you cannot empty your bowel; instead re-visit the toilet at a later time.    Due to recent changes in healthcare laws, you may see the results of your imaging and laboratory studies on MyChart before your provider has had a chance to review them.  We understand that in some cases there may be results that are confusing or concerning to you. Not all laboratory results come back in the same time frame and the provider may be waiting for multiple results in order to interpret others.  Please give [place] 48 hours in order for your provider to thoroughly review all the results before contacting the office for clarification of your results.   Thank you for choosing me and [facility].  [employer]  Cirigliano, D.O.

## 2020-06-29 NOTE — Progress Notes (Signed)
P  Chief Complaint:    Symptomatic Internal Hemorrhoids; Hemorrhoid Band Ligation  GI History: 59 year old male with history of symptomatic internal hemorrhoids (rectal irritation, pruritus ani) along with family history of colon cancer.  -04/19/2020: Successful banding of the LL hemorrhoid -05/17/2020: Successful banding of the RP hemorrhoid -06/29/2020: Presents for hemorrhoid banding #3  Family history notable for sister with colon cancer diagnosed prior to age 23.  Endoscopic History: -Colonoscopy (03/03/2020): 2 subcentimeter polyps in sigmoid colon (path: Tubular adenomas) and internal hemorrhoids, with recommendation repeat in 5 years   HPI:     Patient is a 59 y.o. malewith a history of symptomatic internal hemorrhoids presenting to the Gastroenterology Clinic for follow-up and ongoing treatment. The patient presents with symptomatic grade 2 hemorrhoids, unresponsive to maximal medical therapy, requesting rubber band ligation of symptomatic hemorrhoidal disease.  No change in medical or surgical history, medications, allergies, social history since last appointment with me.  Has done well with each of the hemorrhoid banding so far with good clinical response.   Review of systems:     No chest pain, no SOB, no fevers, no urinary sx   Past Medical History:  Diagnosis Date  . Allergy     Patient's surgical history, family medical history, social history, medications and allergies were all reviewed in Epic    Current Outpatient Medications  Medication Sig Dispense Refill  . Multiple Vitamin (MULTIVITAMIN WITH MINERALS) TABS tablet Take 1 tablet by mouth daily.     No current facility-administered medications for this visit.    Physical Exam:     BP 122/76   Pulse 91   Ht 5\' 11"  (1.803 m)   Wt 180 lb 4 oz (81.8 kg)   BMI 25.14 kg/m   GENERAL:  Pleasant male in NAD PSYCH: : Cooperative, normal affect NEURO: Alert and oriented x 3, no focal neurologic  deficits Rectal exam: Sensation intact and preserved anal wink.  Grade 2 hemorrhoids noted in RA position.  No external anal fissures noted. Normal sphincter tone. No palpable mass. No blood on the exam glove. (Chaperone: Lanny Hurst, CMA).   IMPRESSION and PLAN:    #1.  Symptomatic internal hemorrhoids: PROCEDURE NOTE: The patient presents with symptomatic grade 2 hemorrhoids, unresponsive to maximal medical therapy, requesting rubber band ligation of symptomatic hemorrhoidal disease.  All risks, benefits and alternative forms of therapy were described and informed consent was obtained.  In the Left Lateral Decubitus position, examination revealed grade 2 hemorrhoids in the RA position(s).  The anorectum was pre-medicated with RectiCare. The decision was made to band the RA internal hemorrhoid, and the Emerson was used to perform band ligation without complication.  Digital anorectal examination was then performed to assure proper positioning of the band, and to adjust the banded tissue as required.  The patient was discharged home without pain or other issues.  Dietary and behavioral recommendations were given and along with follow-up instructions.     The following adjunctive treatments were recommended:  -Resume high-fiber diet with fiber supplement (i.e. Citrucel or Benefiber) with goal for soft stools without straining to have a BM. -Resume adequate fluid intake.  The patient will return as needed if recurrence of index hemorrhoidal symptoms for possible additional banding as required. No complications were encountered and the patient tolerated the procedure well.     #2.  Family history of colon cancer #3.  Personal history of colon polyps (tubular adenomas) -Repeat colonoscopy 2026 for ongoing polyp surveillance  RTC prn      [name] ,DO, FACG 06/29/2020, 1:39 PM

## 2021-01-12 ENCOUNTER — Encounter: Payer: 59 | Admitting: Internal Medicine

## 2021-02-01 ENCOUNTER — Encounter: Payer: Self-pay | Admitting: Internal Medicine

## 2021-02-01 ENCOUNTER — Other Ambulatory Visit: Payer: Self-pay

## 2021-02-01 ENCOUNTER — Ambulatory Visit (INDEPENDENT_AMBULATORY_CARE_PROVIDER_SITE_OTHER): Payer: 59 | Admitting: Internal Medicine

## 2021-02-01 VITALS — BP 122/80 | HR 96 | Temp 99.2°F | Resp 16 | Ht 71.0 in | Wt 170.5 lb

## 2021-02-01 DIAGNOSIS — Z1159 Encounter for screening for other viral diseases: Secondary | ICD-10-CM

## 2021-02-01 DIAGNOSIS — Z0000 Encounter for general adult medical examination without abnormal findings: Secondary | ICD-10-CM

## 2021-02-01 DIAGNOSIS — Z114 Encounter for screening for human immunodeficiency virus [HIV]: Secondary | ICD-10-CM

## 2021-02-01 NOTE — Patient Instructions (Signed)
Vaccinations to consider: COVID-vaccine booster Shingles Flu shot every year     GO TO THE LAB : Get the blood work     [place], [place] back for a physical exam in 1 year
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
## 2021-02-02 ENCOUNTER — Encounter: Payer: Self-pay | Admitting: Internal Medicine

## 2021-02-02 LAB — COMPREHENSIVE METABOLIC PANEL
ALT: 15 U/L (ref 0–53)
AST: 17 U/L (ref 0–37)
Albumin: 4.3 g/dL (ref 3.5–5.2)
Alkaline Phosphatase: 49 U/L (ref 39–117)
BUN: 13 mg/dL (ref 6–23)
CO2: 27 mEq/L (ref 19–32)
Calcium: 9.2 mg/dL (ref 8.4–10.5)
Chloride: 102 mEq/L (ref 96–112)
Creatinine, Ser: 0.91 mg/dL (ref 0.40–1.50)
GFR: 92.76 mL/min (ref >60.00)
Glucose, Bld: 90 mg/dL (ref 70–99)
Potassium: 4.1 mEq/L (ref 3.5–5.1)
Sodium: 136 mEq/L (ref 135–145)
Total Bilirubin: 0.4 mg/dL (ref 0.2–1.2)
Total Protein: 6.7 g/dL (ref 6.0–8.3)

## 2021-02-02 LAB — LIPID PANEL
Cholesterol: 178 mg/dL (ref 0–200)
HDL: 58.5 mg/dL (ref >39.00)
LDL Cholesterol: 104 mg/dL — ABNORMAL HIGH (ref 0–99)
NonHDL: 119.25
Total CHOL/HDL Ratio: 3
Triglycerides: 75 mg/dL (ref 0.0–149.0)
VLDL: 15 mg/dL (ref 0.0–40.0)

## 2021-02-02 LAB — HEPATITIS C ANTIBODY
Hepatitis C Ab: NONREACTIVE
SIGNAL TO CUT-OFF: 0.01 (ref <1.00)

## 2021-02-02 LAB — PSA: PSA: 0.45 ng/mL (ref 0.10–4.00)

## 2021-02-02 LAB — HIV ANTIBODY (ROUTINE TESTING W REFLEX): HIV 1&2 Ab, 4th Generation: NONREACTIVE

## 2021-02-02 NOTE — Assessment & Plan Note (Signed)
Td : ~ 2016 per pt S/p pfizer covid shot x 2, booster rec  Shingles d/w pt  rec flu shot q year CCS: + FH sister age ~ 58.  Cscope 02/2020, next per GI Prostate cancer screening: DRE last year with question of mild induration, DRE today normal.  Check PSA. Labs: CMP, FLP, HIV, hep C, PSA Lifestyle: Continue to be very healthy

## 2022-02-03 ENCOUNTER — Ambulatory Visit (INDEPENDENT_AMBULATORY_CARE_PROVIDER_SITE_OTHER): Payer: BC Managed Care – PPO | Admitting: Internal Medicine

## 2022-02-03 ENCOUNTER — Encounter: Payer: Self-pay | Admitting: Internal Medicine

## 2022-02-03 VITALS — BP 118/80 | HR 72 | Ht 71.0 in | Wt 172.8 lb

## 2022-02-03 DIAGNOSIS — R399 Unspecified symptoms and signs involving the genitourinary system: Secondary | ICD-10-CM

## 2022-02-03 DIAGNOSIS — Z0000 Encounter for general adult medical examination without abnormal findings: Secondary | ICD-10-CM | POA: Diagnosis not present

## 2022-02-03 LAB — CBC WITH DIFFERENTIAL/PLATELET
Absolute Monocytes: 472 cells/uL (ref 200–950)
Basophils Relative: 0.5 %
Eosinophils Absolute: 159 cells/uL (ref 15–500)
Eosinophils Relative: 2.7 %
Hemoglobin: 14.2 g/dL (ref 13.2–17.1)
MCH: 29.9 pg (ref 27.0–33.0)
Neutrophils Relative %: 61.1 %
WBC: 5.9 10*3/uL (ref 3.8–10.8)

## 2022-02-03 LAB — URINALYSIS
Bilirubin Urine: NEGATIVE
Hgb urine dipstick: NEGATIVE
Ketones, ur: NEGATIVE
Leukocytes,Ua: NEGATIVE
Nitrite: NEGATIVE
Specific Gravity, Urine: 1.01 (ref 1.000–1.030)
Total Protein, Urine: NEGATIVE
Urine Glucose: NEGATIVE
Urobilinogen, UA: 0.2 (ref 0.0–1.0)
pH: 6 (ref 5.0–8.0)

## 2022-02-03 NOTE — Patient Instructions (Addendum)
Vaccines: COVID booster Shingles vaccine Flu shot every fall  Please read the information about healthcare power of attorney  GO TO THE LAB : Get the blood work     [place], [place] back for   a physical exam in 1 year
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
## 2022-02-04 LAB — CBC WITH DIFFERENTIAL/PLATELET
Basophils Absolute: 30 cells/uL (ref 0–200)
HCT: 41.3 % (ref 38.5–50.0)
Lymphs Abs: 1634 cells/uL (ref 850–3900)
MCHC: 34.4 g/dL (ref 32.0–36.0)
MCV: 86.9 fL (ref 80.0–100.0)
MPV: 10.7 fL (ref 7.5–12.5)
Monocytes Relative: 8 %
Neutro Abs: 3605 cells/uL (ref 1500–7800)
Platelets: 194 10*3/uL (ref 140–400)
RBC: 4.75 10*6/uL (ref 4.20–5.80)
RDW: 12.9 % (ref 11.0–15.0)
Total Lymphocyte: 27.7 %

## 2022-02-04 LAB — COMPREHENSIVE METABOLIC PANEL
AG Ratio: 1.9 (calc) (ref 1.0–2.5)
ALT: 15 U/L (ref 9–46)
AST: 18 U/L (ref 10–35)
Albumin: 4.2 g/dL (ref 3.6–5.1)
Alkaline phosphatase (APISO): 47 U/L (ref 35–144)
BUN: 11 mg/dL (ref 7–25)
CO2: 23 mmol/L (ref 20–32)
Calcium: 9 mg/dL (ref 8.6–10.3)
Chloride: 100 mmol/L (ref 98–110)
Creat: 0.96 mg/dL (ref 0.70–1.30)
Globulin: 2.2 g/dL (calc) (ref 1.9–3.7)
Glucose, Bld: 82 mg/dL (ref 65–99)
Potassium: 4.3 mmol/L (ref 3.5–5.3)
Sodium: 133 mmol/L — ABNORMAL LOW (ref 135–146)
Total Bilirubin: 0.5 mg/dL (ref 0.2–1.2)
Total Protein: 6.4 g/dL (ref 6.1–8.1)

## 2022-02-04 LAB — LIPID PANEL
Cholesterol: 157 mg/dL (ref <200)
HDL: 60 mg/dL (ref >=40)
LDL Cholesterol (Calc): 83 mg/dL (calc)
Non-HDL Cholesterol (Calc): 97 mg/dL (calc) (ref <130)
Total CHOL/HDL Ratio: 2.6 (calc) (ref <5.0)
Triglycerides: 56 mg/dL (ref <150)

## 2022-02-04 LAB — PSA: PSA: 0.47 ng/mL (ref <=4.00)

## 2022-02-05 ENCOUNTER — Encounter: Payer: Self-pay | Admitting: Internal Medicine

## 2022-02-05 NOTE — Assessment & Plan Note (Signed)
-  Td : ~ 2016 per pt - Covid booster rec   - Shingles recommended, patient will call and make an appointment when ready   - rec flu shot q year -CCS: + FH sister age ~ 17.  Cscope 02/2020, next 5 years per GI letter Prostate cancer screening: Has occasional difficulty urinating, DRE normal, checking a UA and PSA Labs:  CMP, FLP, CBC, PSA, UA Lifestyle: Continue to be very healthy POA: Discussed

## 2022-06-20 DIAGNOSIS — J40 Bronchitis, not specified as acute or chronic: Secondary | ICD-10-CM | POA: Diagnosis not present

## 2023-02-09 ENCOUNTER — Encounter: Payer: BC Managed Care – PPO | Admitting: Internal Medicine

## 2023-03-14 ENCOUNTER — Ambulatory Visit (INDEPENDENT_AMBULATORY_CARE_PROVIDER_SITE_OTHER): Payer: BC Managed Care – PPO | Admitting: Internal Medicine

## 2023-03-14 ENCOUNTER — Encounter: Payer: Self-pay | Admitting: Internal Medicine

## 2023-03-14 VITALS — BP 134/82 | HR 65 | Temp 98.7°F | Resp 16 | Ht 71.0 in | Wt 174.1 lb

## 2023-03-14 DIAGNOSIS — R399 Unspecified symptoms and signs involving the genitourinary system: Secondary | ICD-10-CM

## 2023-03-14 DIAGNOSIS — Z13220 Encounter for screening for lipoid disorders: Secondary | ICD-10-CM | POA: Diagnosis not present

## 2023-03-14 DIAGNOSIS — Z0000 Encounter for general adult medical examination without abnormal findings: Secondary | ICD-10-CM

## 2023-03-14 LAB — COMPREHENSIVE METABOLIC PANEL
ALT: 17 U/L (ref 0–53)
AST: 21 U/L (ref 0–37)
Albumin: 4.2 g/dL (ref 3.5–5.2)
Alkaline Phosphatase: 45 U/L (ref 39–117)
BUN: 9 mg/dL (ref 6–23)
CO2: 30 meq/L (ref 19–32)
Calcium: 9.3 mg/dL (ref 8.4–10.5)
Chloride: 101 meq/L (ref 96–112)
Creatinine, Ser: 0.82 mg/dL (ref 0.40–1.50)
GFR: 95.26 mL/min (ref >60.00)
Glucose, Bld: 81 mg/dL (ref 70–99)
Potassium: 4 meq/L (ref 3.5–5.1)
Sodium: 136 meq/L (ref 135–145)
Total Bilirubin: 0.7 mg/dL (ref 0.2–1.2)
Total Protein: 6.4 g/dL (ref 6.0–8.3)

## 2023-03-14 LAB — CBC WITH DIFFERENTIAL/PLATELET
Basophils Absolute: 0 10*3/uL (ref 0.0–0.1)
Basophils Relative: 0.4 % (ref 0.0–3.0)
Eosinophils Absolute: 0.1 10*3/uL (ref 0.0–0.7)
Eosinophils Relative: 2.4 % (ref 0.0–5.0)
HCT: 43.5 % (ref 39.0–52.0)
Hemoglobin: 14.4 g/dL (ref 13.0–17.0)
Lymphocytes Relative: 31.1 % (ref 12.0–46.0)
Lymphs Abs: 1.7 10*3/uL (ref 0.7–4.0)
MCHC: 33.1 g/dL (ref 30.0–36.0)
MCV: 90.1 fl (ref 78.0–100.0)
Monocytes Absolute: 0.4 10*3/uL (ref 0.1–1.0)
Monocytes Relative: 7.4 % (ref 3.0–12.0)
Neutro Abs: 3.1 10*3/uL (ref 1.4–7.7)
Neutrophils Relative %: 58.7 % (ref 43.0–77.0)
Platelets: 188 10*3/uL (ref 150.0–400.0)
RBC: 4.82 Mil/uL (ref 4.22–5.81)
RDW: 13 % (ref 11.5–15.5)
WBC: 5.4 10*3/uL (ref 4.0–10.5)

## 2023-03-14 LAB — LIPID PANEL
Cholesterol: 160 mg/dL (ref 0–200)
HDL: 56.9 mg/dL (ref >39.00)
LDL Cholesterol: 93 mg/dL (ref 0–99)
NonHDL: 103.59
Total CHOL/HDL Ratio: 3
Triglycerides: 52 mg/dL (ref 0.0–149.0)
VLDL: 10.4 mg/dL (ref 0.0–40.0)

## 2023-03-14 LAB — PSA: PSA: 0.56 ng/mL (ref 0.10–4.00)

## 2023-03-14 LAB — TSH: TSH: 1.18 u[IU]/mL (ref 0.35–5.50)
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
## 2023-03-14 NOTE — Assessment & Plan Note (Signed)
Here for CPX - Td : ~ 2016 per pt - Recommend; Covid booster, RSV, shingrix and flu shot. Pros > cons   -CCS: + FH sister age ~ 14.  Cscope 02/2020, next 5 years per GI letter Prostate cancer screening: Mild LUTS, similar to last year, DRE last year was negative.  Recheck a PSA. Labs:  CMP, FLP, CBC, PSA, TSH  Life: Active at work, take walks with his wife sometimes. POA: Discussed RTC 1 year

## 2023-03-14 NOTE — Patient Instructions (Addendum)
Vaccines I recommend: Covid booster- new this fall Flu shot this fall Shingrix (shingles) RSV vaccine   GO TO THE LAB : Get the blood work     GO TO THE FRONT DESK, PLEASE SCHEDULE YOUR APPOINTMENTS Come back for   a physical exam in 1 year      "Health Care Power of attorney" ,  "Living will" (Advance care planning documents)  If you already have a living will or healthcare power of attorney, is recommended you bring the copy to be scanned in your chart.   The document will be available to all the doctors you see in the system.  Advance care planning is a process that supports adults in  understanding and sharing their preferences regarding future medical care.  The patient's preferences are recorded in documents called Advance Directives and the can be modified at any time while the patient is in full mental capacity.   If you don't have one, please consider create one.      More information at: [email or web address]

## 2024-03-14 ENCOUNTER — Encounter: Payer: BC Managed Care – PPO | Admitting: Internal Medicine

## 2024-04-14 ENCOUNTER — Ambulatory Visit (INDEPENDENT_AMBULATORY_CARE_PROVIDER_SITE_OTHER): Admitting: Internal Medicine

## 2024-04-14 ENCOUNTER — Encounter: Payer: Self-pay | Admitting: Internal Medicine

## 2024-04-14 VITALS — BP 130/84 | HR 65 | Temp 98.3°F | Resp 16 | Ht 71.0 in | Wt 176.4 lb

## 2024-04-14 DIAGNOSIS — Z8042 Family history of malignant neoplasm of prostate: Secondary | ICD-10-CM | POA: Diagnosis not present

## 2024-04-14 DIAGNOSIS — R399 Unspecified symptoms and signs involving the genitourinary system: Secondary | ICD-10-CM | POA: Diagnosis not present

## 2024-04-14 DIAGNOSIS — Z0000 Encounter for general adult medical examination without abnormal findings: Secondary | ICD-10-CM
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
## 2024-04-14 NOTE — Patient Instructions (Signed)
 GO TO THE LAB :  Get the blood work    Then, go to the front desk for the checkout Please make an appointment     STOP BY THE FIRST FLOOR:  get the XR   Vaccines I recommend: Flu shot every fall COVID booster Shingles

## 2024-04-15 ENCOUNTER — Encounter: Payer: Self-pay | Admitting: Internal Medicine

## 2024-04-15 LAB — URINALYSIS, ROUTINE W REFLEX MICROSCOPIC
Bilirubin Urine: NEGATIVE
Ketones, ur: NEGATIVE
Leukocytes,Ua: NEGATIVE
Nitrite: NEGATIVE
Specific Gravity, Urine: 1.015 (ref 1.000–1.030)
Total Protein, Urine: NEGATIVE
Urine Glucose: NEGATIVE
Urobilinogen, UA: 0.2 (ref 0.0–1.0)
pH: 6.5 (ref 5.0–8.0)

## 2024-04-15 LAB — CBC WITH DIFFERENTIAL/PLATELET
Basophils Absolute: 0 K/uL (ref 0.0–0.1)
Basophils Relative: 0.4 % (ref 0.0–3.0)
Eosinophils Absolute: 0.1 K/uL (ref 0.0–0.7)
Eosinophils Relative: 1 % (ref 0.0–5.0)
HCT: 44.1 % (ref 39.0–52.0)
Hemoglobin: 14.6 g/dL (ref 13.0–17.0)
Lymphocytes Relative: 29.8 % (ref 12.0–46.0)
Lymphs Abs: 1.5 K/uL (ref 0.7–4.0)
MCHC: 33.1 g/dL (ref 30.0–36.0)
MCV: 89.4 fl (ref 78.0–100.0)
Monocytes Absolute: 0.3 K/uL (ref 0.1–1.0)
Monocytes Relative: 6.6 % (ref 3.0–12.0)
Neutro Abs: 3.2 K/uL (ref 1.4–7.7)
Neutrophils Relative %: 62.2 % (ref 43.0–77.0)
Platelets: 180 K/uL (ref 150.0–400.0)
RBC: 4.93 Mil/uL (ref 4.22–5.81)
RDW: 13.9 % (ref 11.5–15.5)
WBC: 5.1 K/uL (ref 4.0–10.5)

## 2024-04-15 LAB — URINE CULTURE
MICRO NUMBER:: 17060995
Result:: NO GROWTH
SPECIMEN QUALITY:: ADEQUATE

## 2024-04-15 LAB — COMPREHENSIVE METABOLIC PANEL WITH GFR
ALT: 17 U/L (ref 0–53)
AST: 19 U/L (ref 0–37)
Albumin: 4.5 g/dL (ref 3.5–5.2)
Alkaline Phosphatase: 42 U/L (ref 39–117)
BUN: 12 mg/dL (ref 6–23)
CO2: 28 meq/L (ref 19–32)
Calcium: 9.4 mg/dL (ref 8.4–10.5)
Chloride: 98 meq/L (ref 96–112)
Creatinine, Ser: 0.83 mg/dL (ref 0.40–1.50)
GFR: 94.19 mL/min (ref >60.00)
Glucose, Bld: 90 mg/dL (ref 70–99)
Potassium: 4.2 meq/L (ref 3.5–5.1)
Sodium: 134 meq/L — ABNORMAL LOW (ref 135–145)
Total Bilirubin: 0.5 mg/dL (ref 0.2–1.2)
Total Protein: 6.4 g/dL (ref 6.0–8.3)

## 2024-04-15 LAB — LIPID PANEL
Cholesterol: 182 mg/dL (ref 0–200)
HDL: 61 mg/dL (ref >39.00)
LDL Cholesterol: 107 mg/dL — ABNORMAL HIGH (ref 0–99)
NonHDL: 121.15
Total CHOL/HDL Ratio: 3
Triglycerides: 73 mg/dL (ref 0.0–149.0)
VLDL: 14.6 mg/dL (ref 0.0–40.0)

## 2024-04-15 LAB — PSA: PSA: 0.68 ng/mL (ref 0.10–4.00)

## 2024-04-15 NOTE — Assessment & Plan Note (Signed)
 Here for CPX - Td : ~ 2016 per pt - Vaccines I recommend: Flu, COVID booster, shingles.  Pros > cons, declined today, has consistently declined. -CCS: + FH sister age ~ 78.  Cscope 02/2020, next 5 years per GI letter Prostate cancer screening: + FH father age 62 ; mild LUTS, DRE WNL today.  Check PSA UA urine culture. - Labs: See orders Lifestyle: Continue to be active Other issues: LUTS: Very mild, occasional difficulty urinating, DRE negative, checking PSA UA urine culture RTC 1 year

## 2024-04-21 ENCOUNTER — Ambulatory Visit: Payer: Self-pay | Admitting: Internal Medicine

## 2024-04-21 DIAGNOSIS — R8290 Unspecified abnormal findings in urine: Secondary | ICD-10-CM

## 2024-04-21 NOTE — Addendum Note (Signed)
 Addended by: Antwan Pandya D on: 04/21/2024 08:09 AM   Modules accepted: Orders

## 2025-04-15 ENCOUNTER — Encounter: Admitting: Internal Medicine
